# Patient Record
Sex: Female | Born: 2000
Health system: Southern US, Community
[De-identification: ages and names within clinical notes are randomized; demographics above are authoritative.]

## PROBLEM LIST (undated history)

## (undated) DIAGNOSIS — J302 Other seasonal allergic rhinitis: Secondary | ICD-10-CM

## (undated) HISTORY — PX: ADENOIDECTOMY: SUR15

## (undated) HISTORY — PX: TONSILLECTOMY: SUR1361

---
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
## 2021-09-10 IMAGING — DX DG CHEST 2V
2 series · 2 of 2 positions shown · non-contrast
Comparison: 10/13/2016

CLINICAL DATA: Cough, congestion and low-grade fever.

EXAM:
CHEST - 2 VIEW

[chest pa]
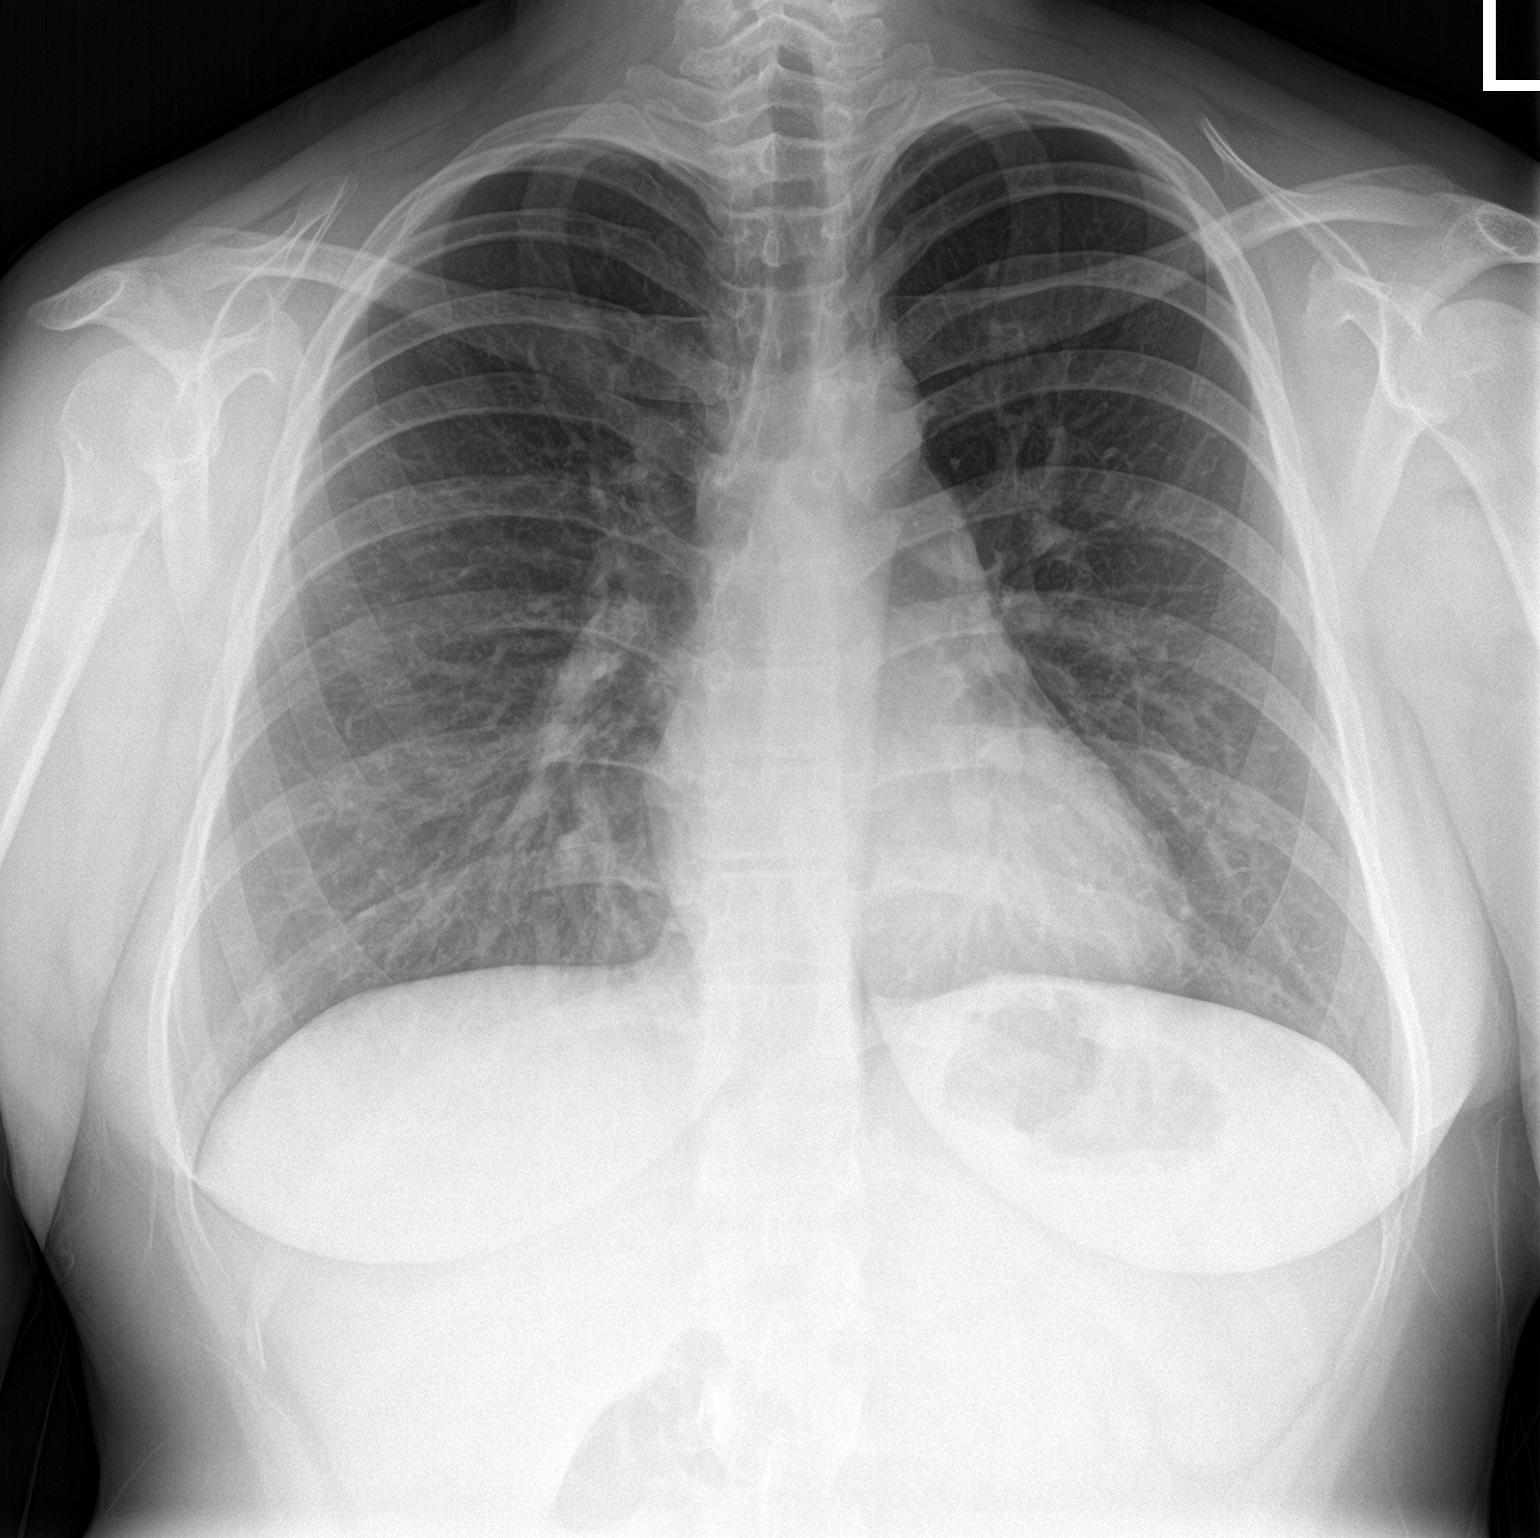

[chest lat]
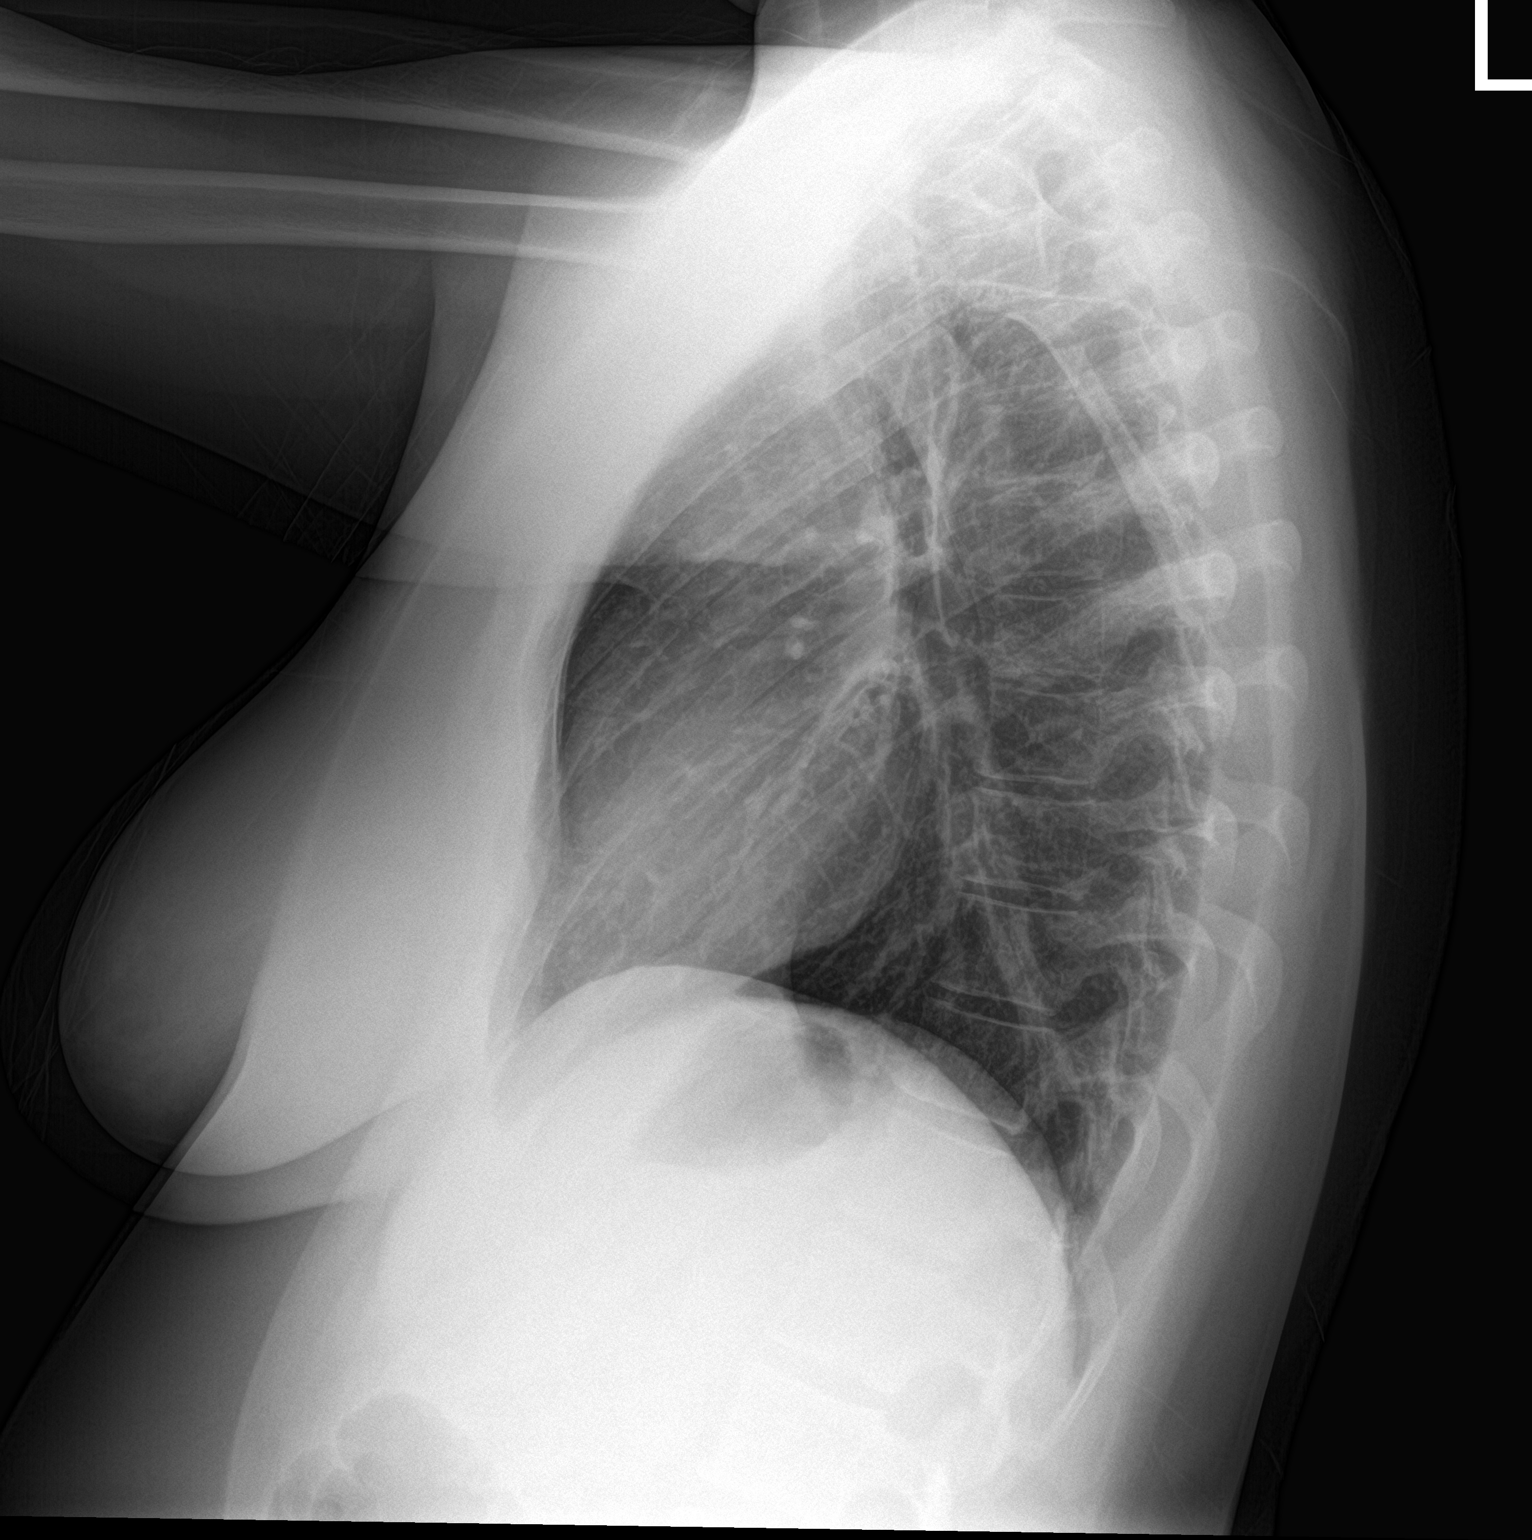

[2 of 2 positions shown; findings below may reference images not displayed]

FINDINGS: The heart size and mediastinal contours are within normal limits.
Both lungs are clear. The visualized skeletal structures are
unremarkable.
IMPRESSION: No active cardiopulmonary disease.

## 2023-04-22 ENCOUNTER — Other Ambulatory Visit (HOSPITAL_BASED_OUTPATIENT_CLINIC_OR_DEPARTMENT_OTHER): Payer: Self-pay
